# Patient Record
Sex: Female | Born: 1975 | Race: White | Hispanic: No | Marital: Married | Smoking: Former smoker
Health system: Southern US, Community
[De-identification: ages and names within clinical notes are randomized; demographics above are authoritative.]

## PROBLEM LIST (undated history)

## (undated) DIAGNOSIS — E039 Hypothyroidism, unspecified: Secondary | ICD-10-CM

## (undated) DIAGNOSIS — D649 Anemia, unspecified: Secondary | ICD-10-CM

## (undated) DIAGNOSIS — F419 Anxiety disorder, unspecified: Secondary | ICD-10-CM

## (undated) DIAGNOSIS — F329 Major depressive disorder, single episode, unspecified: Secondary | ICD-10-CM

## (undated) DIAGNOSIS — F32A Depression, unspecified: Secondary | ICD-10-CM

## (undated) HISTORY — DX: Depression, unspecified: F32.A

## (undated) HISTORY — DX: Anemia, unspecified: D64.9

## (undated) HISTORY — DX: Hypothyroidism, unspecified: E03.9

## (undated) HISTORY — DX: Anxiety disorder, unspecified: F41.9

## (undated) HISTORY — DX: Major depressive disorder, single episode, unspecified: F32.9

## (undated) HISTORY — PX: NO PAST SURGERIES: SHX2092

---

## 1998-05-28 ENCOUNTER — Inpatient Hospital Stay (HOSPITAL_COMMUNITY): Admission: AD | Admit: 1998-05-28 | Discharge: 1998-05-31 | Payer: Self-pay

## 1999-12-21 ENCOUNTER — Other Ambulatory Visit: Admission: RE | Admit: 1999-12-21 | Discharge: 1999-12-21 | Payer: Self-pay | Admitting: Obstetrics and Gynecology

## 2000-07-19 ENCOUNTER — Inpatient Hospital Stay (HOSPITAL_COMMUNITY): Admission: AD | Admit: 2000-07-19 | Discharge: 2000-07-21 | Payer: Self-pay | Admitting: Obstetrics and Gynecology

## 2001-01-18 ENCOUNTER — Other Ambulatory Visit: Admission: RE | Admit: 2001-01-18 | Discharge: 2001-01-18 | Payer: Self-pay | Admitting: Obstetrics and Gynecology

## 2001-11-22 ENCOUNTER — Ambulatory Visit (HOSPITAL_COMMUNITY): Admission: RE | Admit: 2001-11-22 | Discharge: 2001-11-22 | Payer: Self-pay | Admitting: Obstetrics & Gynecology

## 2001-11-22 ENCOUNTER — Encounter: Payer: Self-pay | Admitting: Obstetrics & Gynecology

## 2002-01-19 ENCOUNTER — Encounter: Payer: Self-pay | Admitting: Obstetrics & Gynecology

## 2002-01-19 ENCOUNTER — Ambulatory Visit (HOSPITAL_COMMUNITY): Admission: RE | Admit: 2002-01-19 | Discharge: 2002-01-19 | Payer: Self-pay | Admitting: Obstetrics & Gynecology

## 2004-06-22 ENCOUNTER — Emergency Department: Payer: Self-pay | Admitting: Emergency Medicine

## 2006-06-20 ENCOUNTER — Inpatient Hospital Stay: Payer: Self-pay | Admitting: Obstetrics and Gynecology

## 2010-03-05 ENCOUNTER — Emergency Department: Payer: Self-pay | Admitting: Emergency Medicine

## 2011-12-14 IMAGING — CR DG CHEST 1V PORT
1 series · 1 of 1 positions shown · non-contrast
Comparison: none

REASON FOR EXAM: cp
COMMENTS:

PROCEDURE:     DXR - DXR PORTABLE CHEST SINGLE VIEW  - March 05, 2010 [DATE]
RESULT:     The lung fields are clear. The heart, mediastinal and osseous
structures show no significant abnormalities.

[view not recorded]
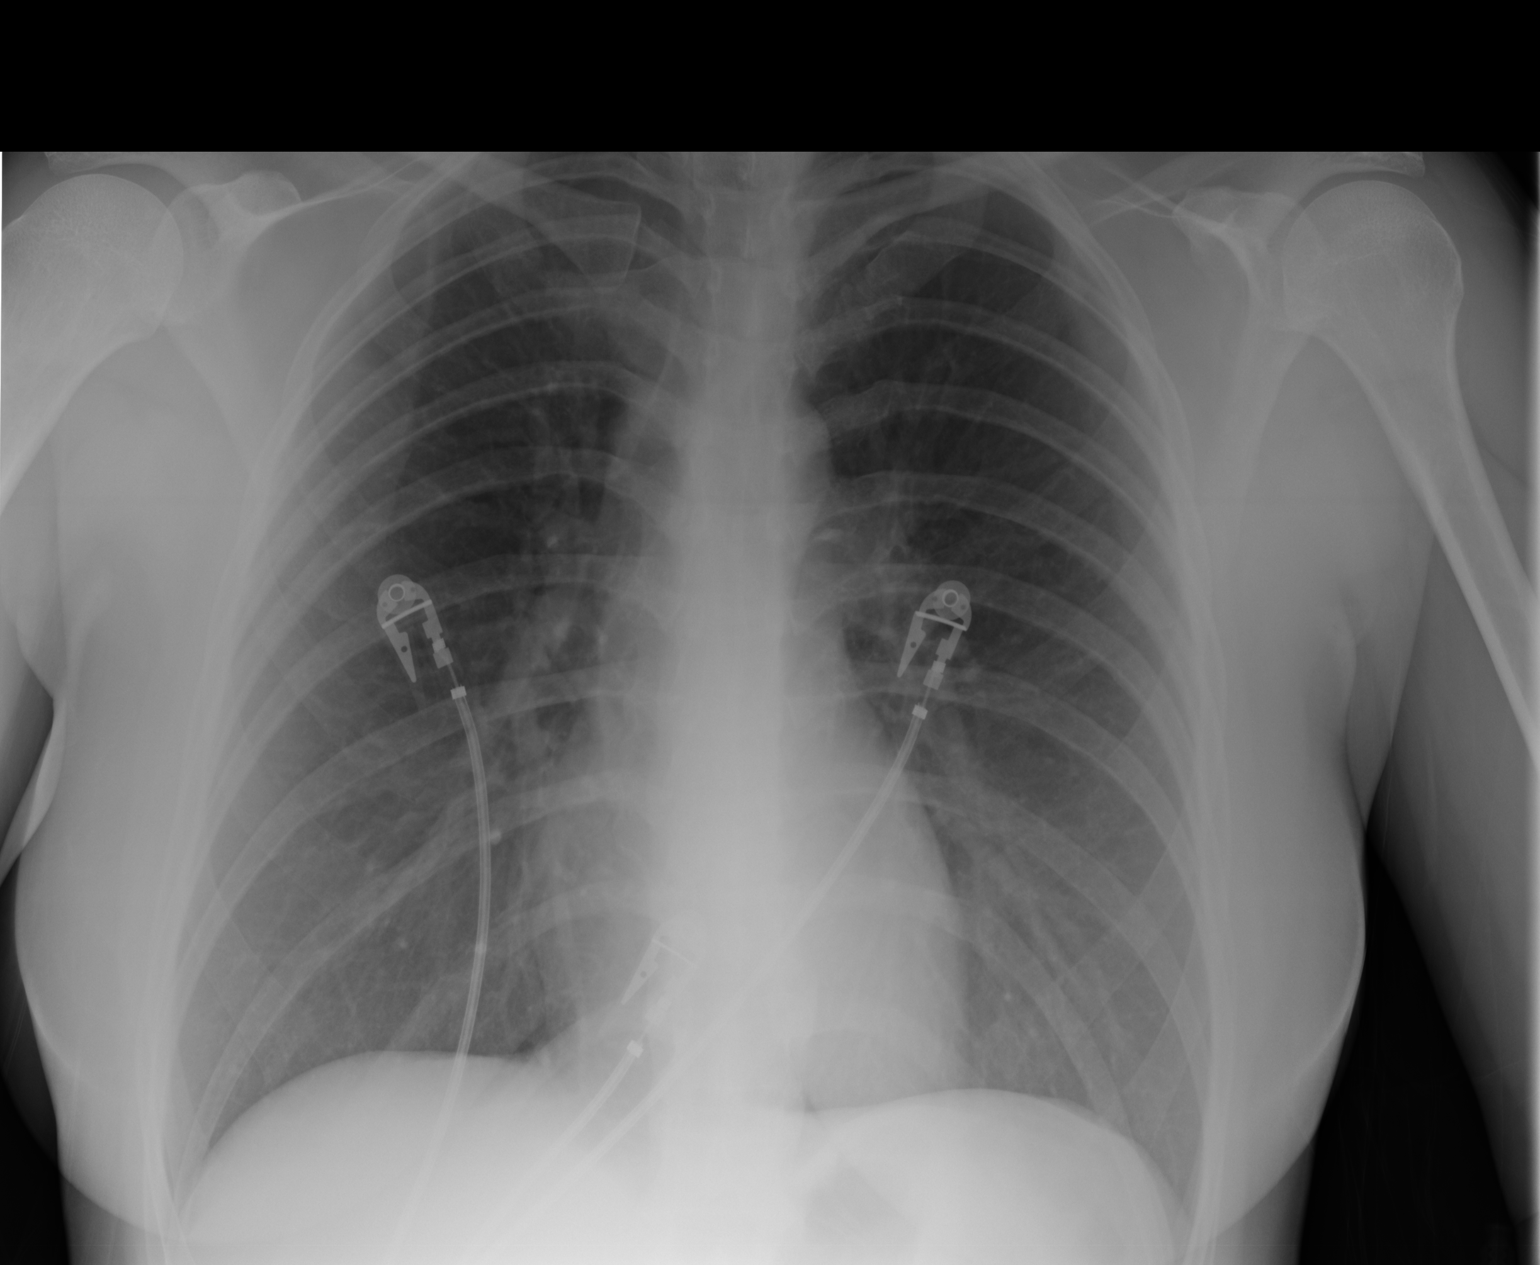

[1 of 1 positions shown; findings below may reference images not displayed]

IMPRESSION: No significant abnormalities are noted.

## 2012-07-01 ENCOUNTER — Emergency Department: Payer: Self-pay | Admitting: Emergency Medicine

## 2012-07-01 LAB — BASIC METABOLIC PANEL
Anion Gap: 11 (ref 7–16)
BUN: 10 mg/dL (ref 7–18)
Calcium, Total: 9 mg/dL (ref 8.5–10.1)
Chloride: 101 mmol/L (ref 98–107)
Co2: 23 mmol/L (ref 21–32)
Creatinine: 1.18 mg/dL (ref 0.60–1.30)
EGFR (African American): >60
EGFR (Non-African Amer.): 58 — ABNORMAL LOW
Glucose: 109 mg/dL — ABNORMAL HIGH (ref 65–99)
Osmolality: 270 (ref 275–301)
Potassium: 3.4 mmol/L — ABNORMAL LOW (ref 3.5–5.1)
Sodium: 135 mmol/L — ABNORMAL LOW (ref 136–145)

## 2012-07-01 LAB — CK TOTAL AND CKMB (NOT AT ARMC)
CK, Total: 73 U/L (ref 21–215)
CK-MB: <0.5 ng/mL — ABNORMAL LOW (ref 0.5–3.6)

## 2012-07-01 LAB — CBC
HCT: 41.3 % (ref 35.0–47.0)
HGB: 14.2 g/dL (ref 12.0–16.0)
MCH: 31.3 pg (ref 26.0–34.0)
MCHC: 34.4 g/dL (ref 32.0–36.0)
MCV: 91 fL (ref 80–100)
Platelet: 246 10*3/uL (ref 150–440)
RBC: 4.54 10*6/uL (ref 3.80–5.20)
RDW: 13.4 % (ref 11.5–14.5)
WBC: 6.4 10*3/uL (ref 3.6–11.0)

## 2012-07-01 LAB — TROPONIN I: Troponin-I: <0.02 ng/mL

## 2012-07-01 LAB — TSH: Thyroid Stimulating Horm: 5.6 u[IU]/mL — ABNORMAL HIGH

## 2013-04-27 ENCOUNTER — Ambulatory Visit: Payer: Self-pay | Admitting: Family Medicine

## 2015-02-05 IMAGING — CR LEFT KNEE - 3 VIEW
1 series · 4 of 4 positions shown · non-contrast
Comparison: none

REASON FOR EXAM: L knee pain
COMMENTS:

PROCEDURE:     KDR - KDXR KNEE LT- 3 VIEWS ONLY  - April 27, 2013  [DATE]
RESULT:     Four views of the left knee reveal the bones to be adequately
mineralized. There is no evidence of an acute fracture nor dislocation. The
overlying soft tissues are normal in appearance.

[Series 1: ap · 0.17mm/px · 4 of 4 slices shown]
[im 1/4]
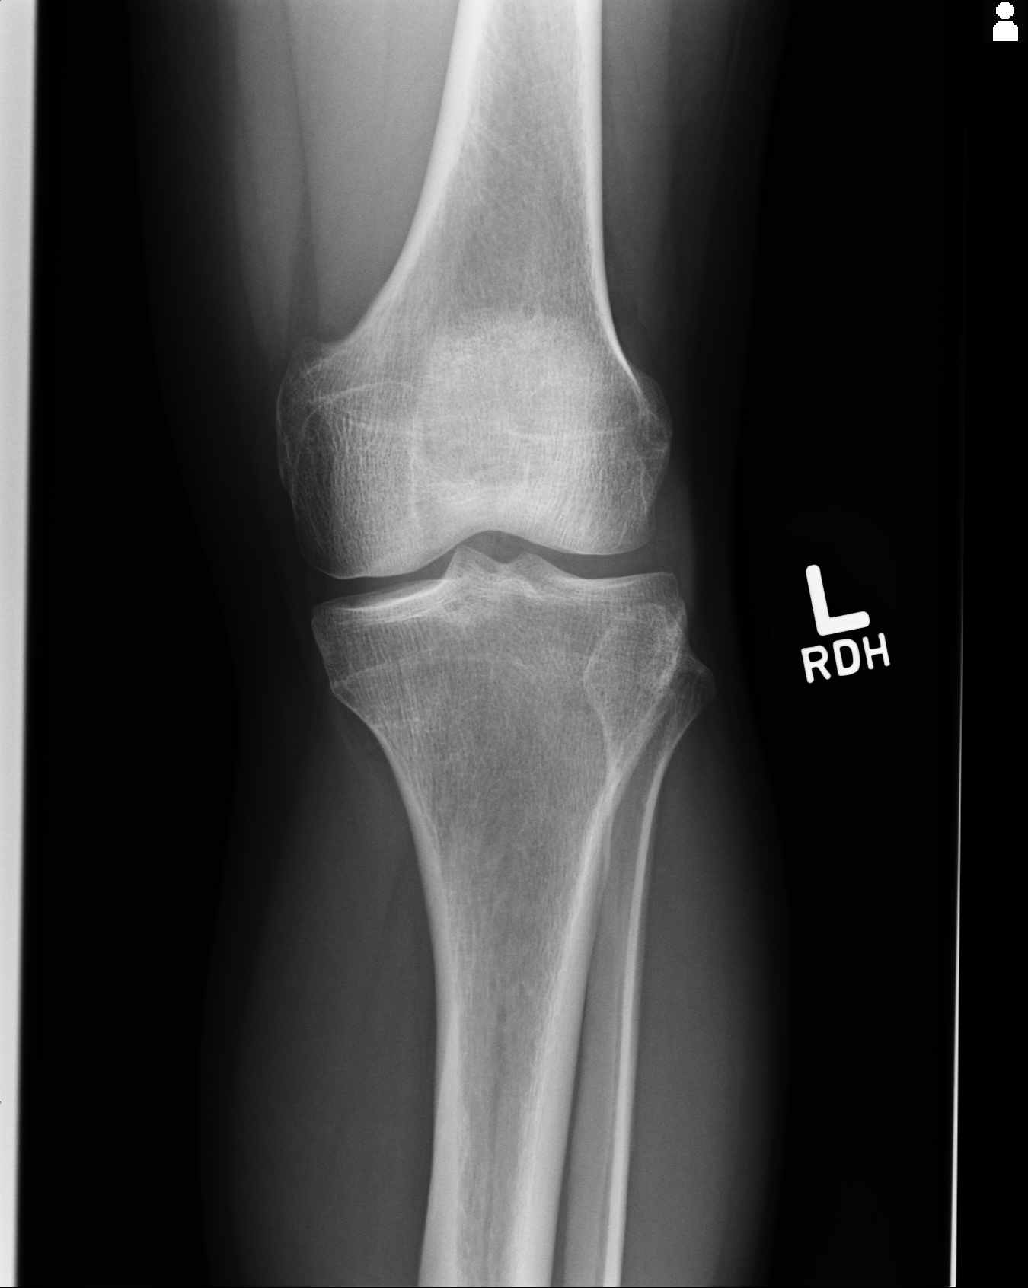
[im 2/4]
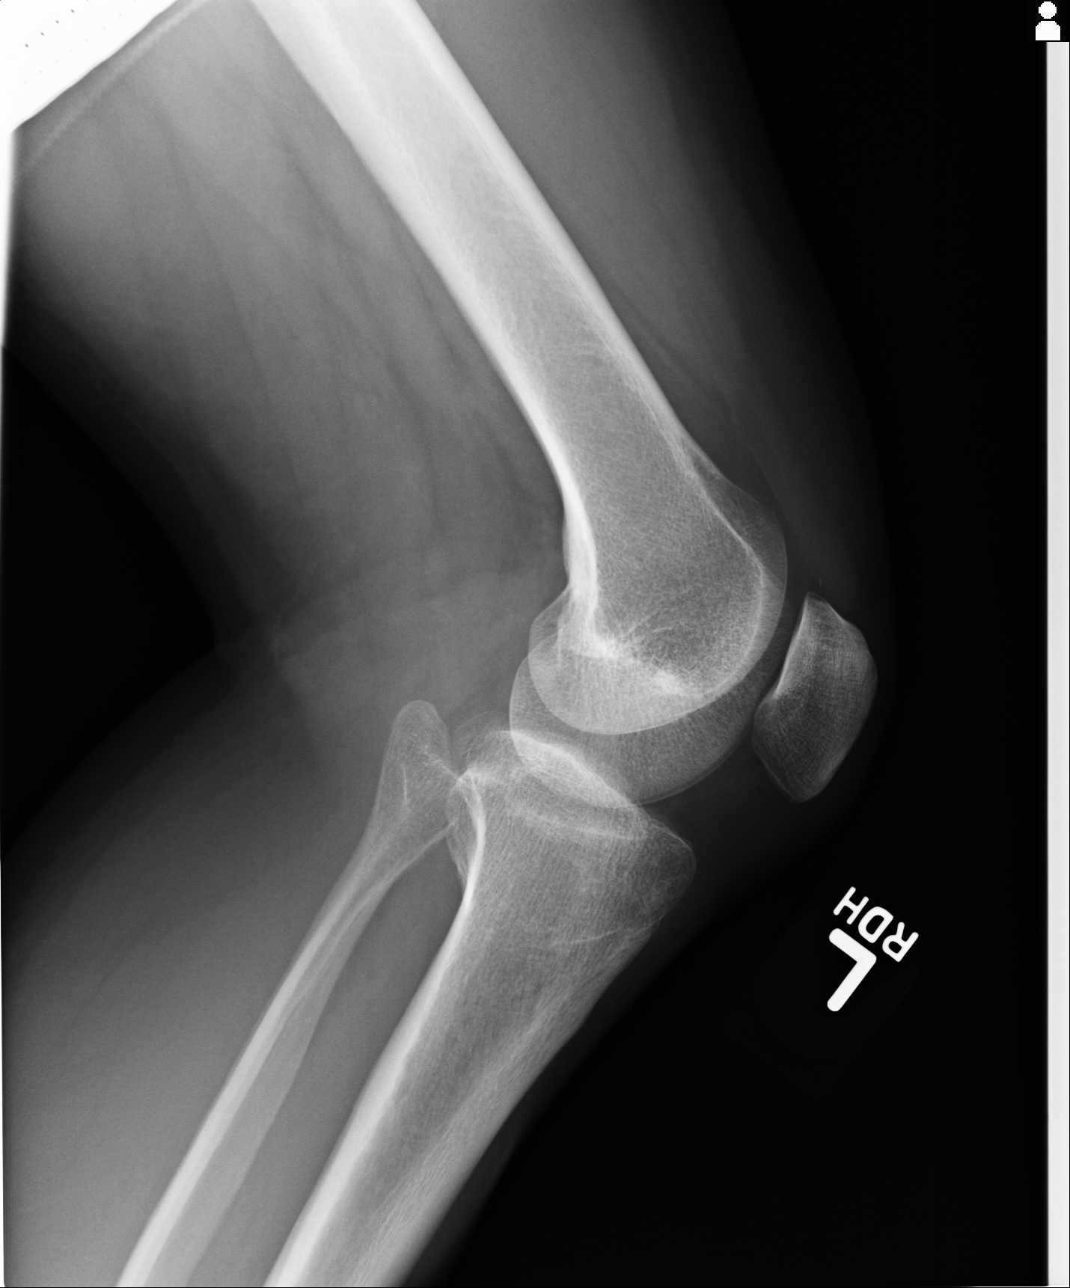
[im 3/4]
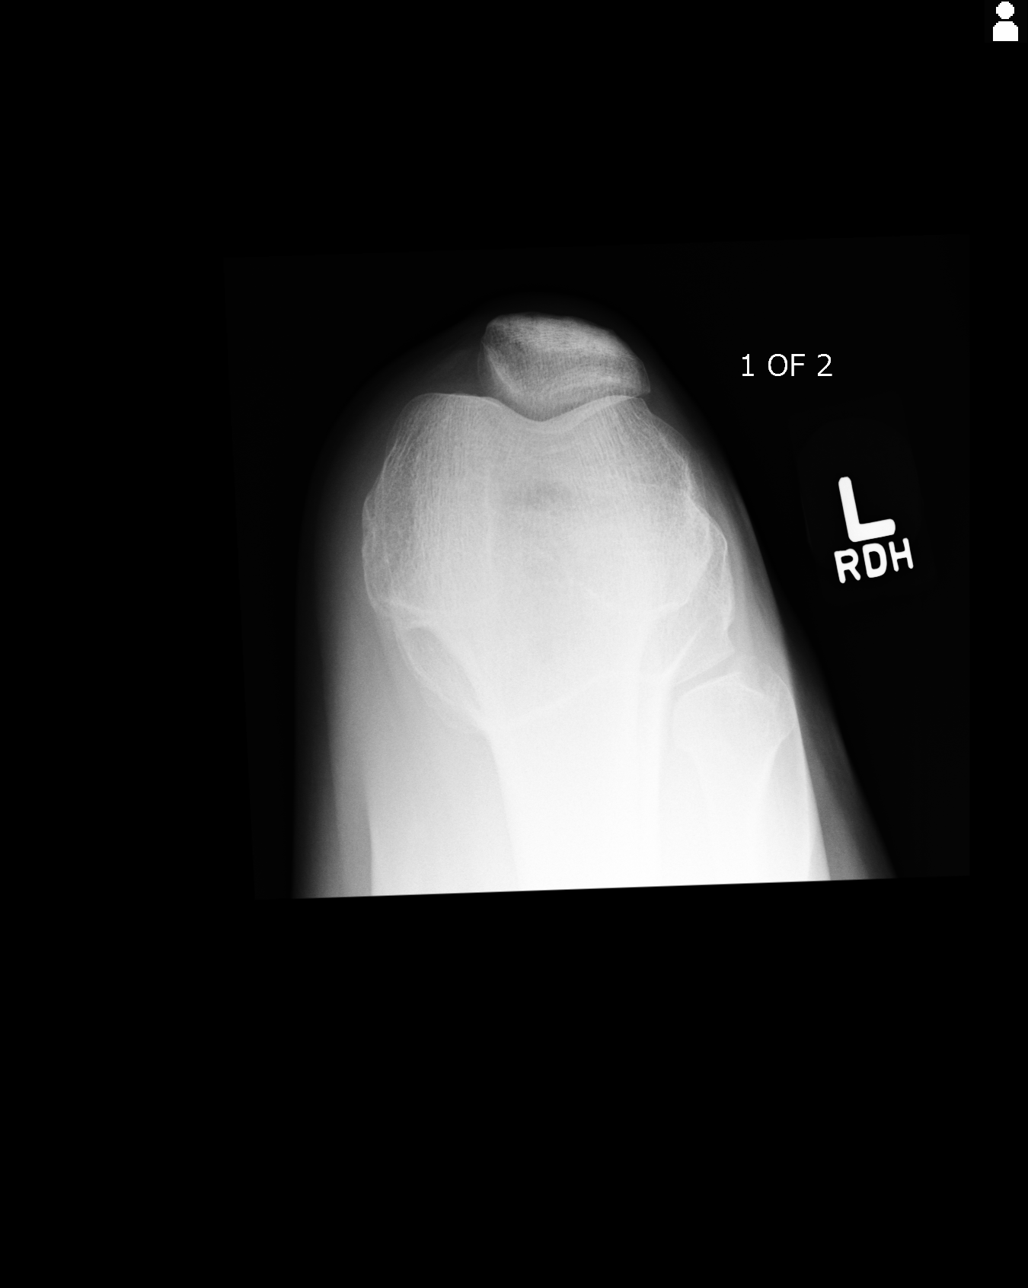
[im 4/4]
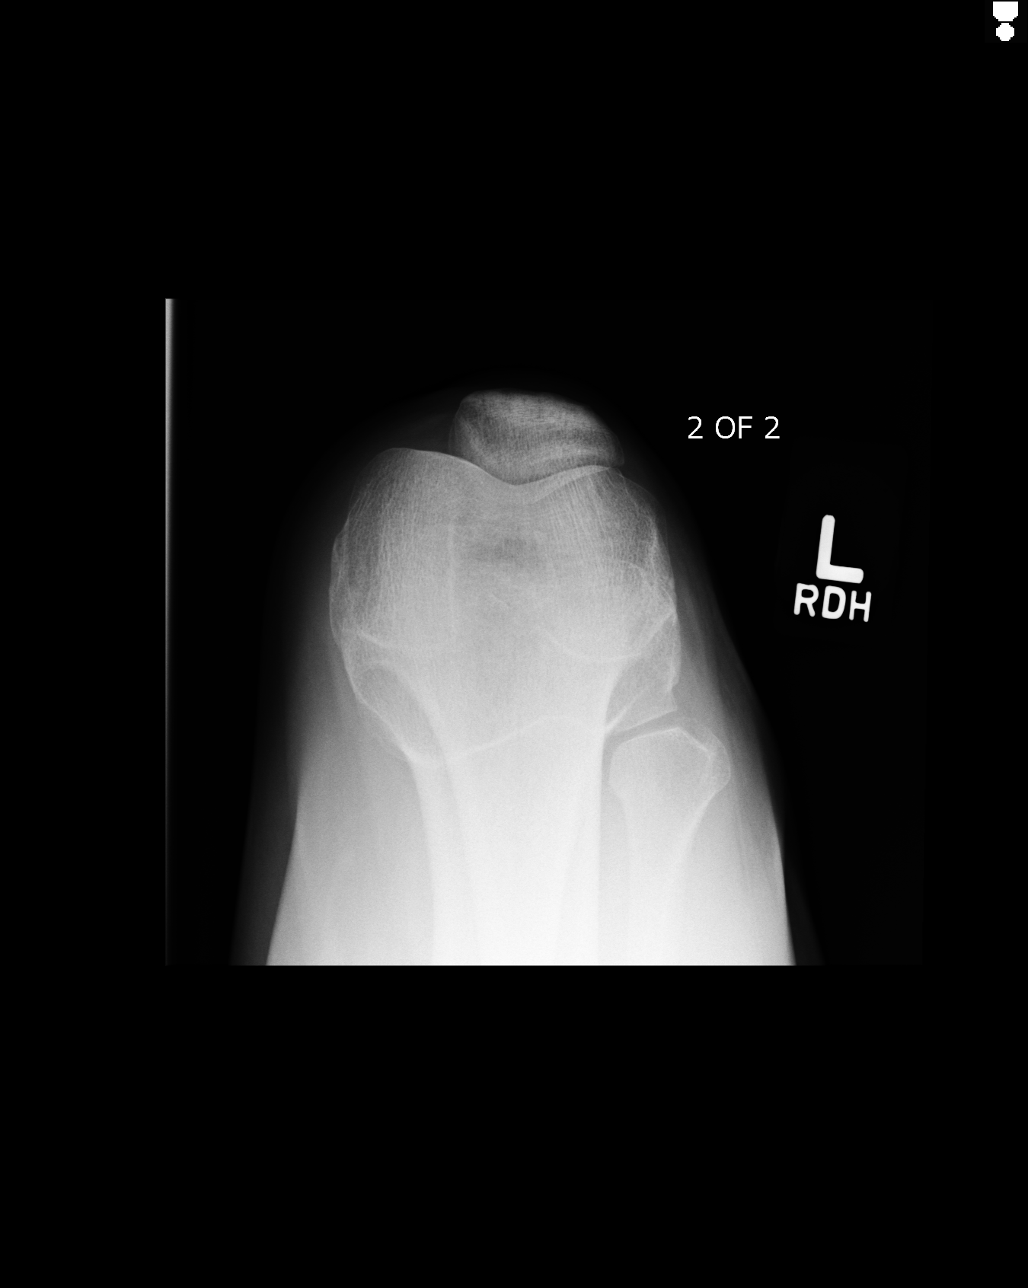

[4 of 4 positions shown; findings below may reference images not displayed]

IMPRESSION: There is no acute bony abnormality of the left knee. Given
the chronic symptoms, followup MRI may be useful.

[REDACTED]

## 2015-05-16 ENCOUNTER — Encounter: Payer: Self-pay | Admitting: Family Medicine

## 2015-05-16 ENCOUNTER — Ambulatory Visit (INDEPENDENT_AMBULATORY_CARE_PROVIDER_SITE_OTHER): Payer: BLUE CROSS/BLUE SHIELD | Admitting: Family Medicine

## 2015-05-16 VITALS — BP 116/74 | HR 66 | Temp 97.8°F | Resp 16 | Wt 140.4 lb

## 2015-05-16 DIAGNOSIS — M7661 Achilles tendinitis, right leg: Secondary | ICD-10-CM | POA: Diagnosis not present

## 2015-05-16 DIAGNOSIS — Z87898 Personal history of other specified conditions: Secondary | ICD-10-CM

## 2015-05-16 DIAGNOSIS — M7662 Achilles tendinitis, left leg: Secondary | ICD-10-CM

## 2015-05-16 DIAGNOSIS — E039 Hypothyroidism, unspecified: Secondary | ICD-10-CM | POA: Insufficient documentation

## 2015-05-16 MED ORDER — FUROSEMIDE 20 MG PO TABS: 20.0000 mg | ORAL_TABLET | Freq: Every day | ORAL | Status: AC

## 2015-05-16 NOTE — Progress Notes (Signed)
Subjective:     Patient ID: Hailey Cantrell, female   DOB: June 09, 1976, 39 y.o.   MRN: [id number]  HPI  Chief Complaint  Patient presents with  . Ankle Pain    Patient comes in office today with concerns of pain in her left ankle and heel of foot. Patient reports that she is a active runner that runs daily, on Saturday 9/24 patient participated in a race and states that she pushed her self harder than she has before. Patient denies fall but states after the race she noticed pain in ankle and next morning had bruising around ankle.   Continues to have bilateral ankle pain particularly going up and down steps. Reports her 5K race was on even surfaces, she typically wears a "five fingers" sock like shoe. States she typically will run daily 5-8 miles. Has been taking ibuprofen 600 mg as needed and icing for pain.   Review of Systems  Endocrine:       Also wishes refill on furosemide which she uses for PMS related edema.       Objective:   Physical Exam  Constitutional: She appears well-developed and well-nourished. No distress.  Musculoskeletal:  Tender over bilateral ankles (left > right) at insertion of Achille's. Bilateral DF/PF 5/5; ankle ligaments stable with no pain on testing.       Assessment:    1. Achilles tendonitis, bilateral: may also be bursitis involvement. - Ambulatory referral to Podiatry  2. History of peripheral edema - furosemide (LASIX) 20 MG tablet; Take 1 tablet (20 mg total) by mouth daily. As needed for swelling  Dispense: 30 tablet; Refill: 3    Plan:    Cross train and stop running for now. Schedule ibuprofen 600 mg. 3 x day with food.

## 2015-05-16 NOTE — Patient Instructions (Signed)
Schedule ibuprofen 600 mg. 3 x day with meals. Cross train without running until seen by the podiatrist.

## 2015-05-20 ENCOUNTER — Ambulatory Visit (INDEPENDENT_AMBULATORY_CARE_PROVIDER_SITE_OTHER): Payer: BLUE CROSS/BLUE SHIELD

## 2015-05-20 ENCOUNTER — Ambulatory Visit (INDEPENDENT_AMBULATORY_CARE_PROVIDER_SITE_OTHER): Payer: BLUE CROSS/BLUE SHIELD | Admitting: Sports Medicine

## 2015-05-20 DIAGNOSIS — M25579 Pain in unspecified ankle and joints of unspecified foot: Secondary | ICD-10-CM

## 2015-05-20 DIAGNOSIS — M216X9 Other acquired deformities of unspecified foot: Secondary | ICD-10-CM

## 2015-05-20 DIAGNOSIS — M79671 Pain in right foot: Secondary | ICD-10-CM | POA: Diagnosis not present

## 2015-05-20 DIAGNOSIS — M21869 Other specified acquired deformities of unspecified lower leg: Secondary | ICD-10-CM

## 2015-05-20 DIAGNOSIS — M779 Enthesopathy, unspecified: Secondary | ICD-10-CM | POA: Diagnosis not present

## 2015-05-20 DIAGNOSIS — M79672 Pain in left foot: Secondary | ICD-10-CM

## 2015-05-20 MED ORDER — TRIAMCINOLONE ACETONIDE 10 MG/ML IJ SUSP: 10.0000 mg | Freq: Once | INTRAMUSCULAR | Status: DC

## 2015-05-20 MED ORDER — MELOXICAM 15 MG PO TABS: 15.0000 mg | ORAL_TABLET | Freq: Every day | ORAL | Status: DC

## 2015-05-20 NOTE — Progress Notes (Signed)
Subjective:    Patient ID: Janne Napoleon, female    DOB: 07-01-1976, 39 y.o.   MRN: [id number]  HPI: [place] 39 y/o female patient presents to office complaining of pain in both ankles Left now greater than right. Patient reports that she is a runner and about 1 month ago was running in her Vibram 5 shoes and started to get pain in both ankles. The pain gradually got better with rest however 2 weeks ago was running and started having increased symptoms in left ankle and shins; 4/10, throbbing. Patient reports that this run she participated in 2 weeks ago consisted of a lot of hills. Since patient has tried rest, epsom salt soaks, icing with some relief. Walking or jogging makes the pain worse. Patient denies any injury or any other acute problems.   Patient Active Problem List   Diagnosis Date Noted  . Hypothyroidism 05/16/2015   Current Outpatient Prescriptions on File Prior to Visit  Medication Sig Dispense Refill  . Ashwagandha 500 MG CAPS Take by mouth 2 (two) times daily.    . ferrous sulfate 325 (65 FE) MG tablet Take 325 mg by mouth daily with breakfast.    . furosemide (LASIX) 20 MG tablet Take 1 tablet (20 mg total) by mouth daily. As needed for swelling 30 tablet 3  . levothyroxine (SYNTHROID, LEVOTHROID) 150 MCG tablet Take 150 mcg by mouth daily before breakfast.    . Misc Natural Products (OSTEO BI-FLEX ADV JOINT SHIELD PO) Take by mouth daily.     No current facility-administered medications on file prior to visit.   Allergies  Allergen Reactions  . Sulfa Antibiotics Other (See Comments)    vomiting    Review of Systems  Endocrine: Positive for cold intolerance.  Musculoskeletal: Positive for gait problem.       Joint pain   All other systems reviewed and are negative.      Objective:   Physical Exam   Objective:  General: Alert and oriented x3 in no acute distress  Dermatology: No open lesions bilateral lower extremities, no webspace macerations, no  ecchymosis bilateral, all nails x 10 are well manicured.  Vascular: Dorsalis Pedis and Posterior Tibial pedal pulses 2/4, Capillary Fill Time 3 seconds, + pedal hair growth bilateral, no edema bilateral lower extremities, Temperature gradient within normal limits.  Neurology: Gross sensation intact via light touch bilateral, Protective sensation intact  with Semmes Weinstein Monofilament to all pedal sites, Position sense intact, vibratory intact bilateral, Deep tendon reflexes within normal limits bilateral, No babinski sign present bilateral. No Tinels sign bilateral foot.   Musculoskeletal: There is tenderness with palpation at medial insertion of the Achilles on Left, there is no calcaneal exostosis, There is no pain with palpation to medial and lateral ankle gutters bilateral. There is mild tenderness to anterior shins bilateral and course of PT and PL tendons on left, tendons intact and functioning with full 5/5 muscular strength in all groups, There is decreased ankle rom with knee extending  vs flexed resembling gastroc equnius bilateral, The achilles tendon feels intact with no nodularity or palpable dell, Thompson sign negative, Subtalar and midtarsal joint range of motion is within normal limits, there is no 1st ray hypermobility or forefoot deformity noted bilateral.   Gait: Antalgic gait with increased heel off bilateral.   Xrays  Left & Right Ankle/Foot 3 views    Impression: Kagers triangle intact, Ankle mortise intact, no DJD, no fracture/stress reaction, mild midtarsal sag and 1st  ray elevatus          Assessment & Plan:   Problem List Items Addressed This Visit    None    Visit Diagnoses    Ankle pain, unspecified laterality    -  Primary    Relevant Orders    DG Ankle Complete Left    DG Ankle Complete Right    Tendonitis        Relevant Medications    triamcinolone acetonide (KENALOG) 10 MG/ML injection 10 mg    Foot pain, bilateral        Gastrocnemius equinus,  unspecified laterality           -Complete examination performed -Xrays reviewed -Discussed treatement options -After verbal consent injected medial insertion of the achilles with 1cc lidocaine plain, 1cc marcaine plain, 0.5cc kenalog 10 mixed with 0.5cc dexamethasone. Advised patient of risks associated with injection. Advised avoid vigorous activity. Dispensed CAM boot to wear at all times when ambulating. -Rx Meloxicam and gentle passive stretching at end of day. Recommend ice the area 2-3x per day. -Will recheck in 2 weeks. Patient to bring in running shoes next visit.  -Patient to return to office sooner if condition worsens.  Asencion Islam, DPM

## 2015-05-21 ENCOUNTER — Encounter: Payer: Self-pay | Admitting: Sports Medicine

## 2015-05-23 ENCOUNTER — Encounter: Payer: Self-pay | Admitting: Sports Medicine

## 2015-06-03 ENCOUNTER — Ambulatory Visit (INDEPENDENT_AMBULATORY_CARE_PROVIDER_SITE_OTHER): Payer: BLUE CROSS/BLUE SHIELD | Admitting: Sports Medicine

## 2015-06-03 ENCOUNTER — Encounter: Payer: Self-pay | Admitting: Sports Medicine

## 2015-06-03 VITALS — BP 117/74 | HR 78 | Resp 16

## 2015-06-03 DIAGNOSIS — M779 Enthesopathy, unspecified: Secondary | ICD-10-CM

## 2015-06-03 DIAGNOSIS — M79672 Pain in left foot: Secondary | ICD-10-CM

## 2015-06-03 DIAGNOSIS — M25579 Pain in unspecified ankle and joints of unspecified foot: Secondary | ICD-10-CM | POA: Diagnosis not present

## 2015-06-03 DIAGNOSIS — M21869 Other specified acquired deformities of unspecified lower leg: Secondary | ICD-10-CM

## 2015-06-03 DIAGNOSIS — M216X9 Other acquired deformities of unspecified foot: Secondary | ICD-10-CM

## 2015-06-03 DIAGNOSIS — M79671 Pain in right foot: Secondary | ICD-10-CM | POA: Diagnosis not present

## 2015-06-03 NOTE — Progress Notes (Signed)
Patient ID: Hailey Cantrell, female   DOB: 05/09/1976, 39 y.o.   MRN: [id number] Subjective: Hailey Cantrell is a 39 y.o. female patient who returns to office for follow up evaluation of Left>Right ankle/foot pain. Patient states that she is feeling better. Patient has been using CAM walker with relief in symptoms. Patient denies any other pedal complaints.   Objective:  General: Alert and oriented x3 in no acute distress  Dermatology: No open lesions bilateral lower extremities, no webspace macerations, no ecchymosis bilateral, all nails x 10 are well manicured.  Neurovascular: Intact. No lower extremity edema bilateral.   Musculoskeletal:Decreased tenderness with palpation at medial insertion of achilles on Left, No other pedal pains,No pain with calf compression bilateral. Range of motion in all pedal joints are within normal limits without pain or crepitation; Mild limitation at ankles bilateral, improving with stretching. Strength within normal limits in all groups bilateral.   Assessment and Plan: Problem List Items Addressed This Visit    None    Visit Diagnoses    Ankle pain, unspecified laterality    -  Primary    L>R, Improved    Foot pain, bilateral        Improved    Tendonitis        Improved    Gastrocnemius equinus, unspecified laterality           -Complete examination performed -Running shoes evaluated; Encouraged patient to return to a more supportive shoe with firmer heel counter  -May transition to normal shoes and may slowly start increasing activity/running; Advised patient to closely monitor symptoms. Patient made aware of the possibility of re-exacerbation if not wearing supportive shoes; Advised to ice, stretch, gentle massage as needed. -Patient to return to office as needed or sooner if condition worsens.  Asencion Islamitorya Zariana Strub, DPM

## 2015-07-23 ENCOUNTER — Other Ambulatory Visit: Payer: Self-pay | Admitting: Family Medicine

## 2015-07-23 DIAGNOSIS — E039 Hypothyroidism, unspecified: Secondary | ICD-10-CM

## 2015-07-25 ENCOUNTER — Other Ambulatory Visit: Payer: Self-pay | Admitting: Family Medicine

## 2015-07-26 LAB — TSH: TSH: 0.37 u[IU]/mL — AB (ref 0.450–4.500)

## 2015-07-26 LAB — T4, FREE: Free T4: 1.44 ng/dL (ref 0.82–1.77)

## 2015-07-29 ENCOUNTER — Other Ambulatory Visit: Payer: Self-pay | Admitting: Family Medicine

## 2015-07-29 ENCOUNTER — Telehealth: Payer: Self-pay

## 2015-07-29 NOTE — Telephone Encounter (Signed)
-----   Message from Anola Gurneyobert Chauvin, [place] sent at 07/29/2015  3:11 PM EST ----- Labs ok, repeat annually

## 2015-07-29 NOTE — Telephone Encounter (Signed)
[id number] 

## 2015-07-30 NOTE — Telephone Encounter (Signed)
Patient has been advised of lab report. KW 

## 2015-07-30 NOTE — Telephone Encounter (Signed)
LMTCB

## 2015-08-13 ENCOUNTER — Ambulatory Visit (INDEPENDENT_AMBULATORY_CARE_PROVIDER_SITE_OTHER): Payer: BLUE CROSS/BLUE SHIELD | Admitting: Family Medicine

## 2015-08-13 ENCOUNTER — Encounter: Payer: Self-pay | Admitting: Family Medicine

## 2015-08-13 VITALS — BP 108/68 | HR 66 | Temp 97.7°F | Resp 16 | Wt 146.0 lb

## 2015-08-13 DIAGNOSIS — J0100 Acute maxillary sinusitis, unspecified: Secondary | ICD-10-CM | POA: Diagnosis not present

## 2015-08-13 DIAGNOSIS — R609 Edema, unspecified: Secondary | ICD-10-CM | POA: Insufficient documentation

## 2015-08-13 DIAGNOSIS — D649 Anemia, unspecified: Secondary | ICD-10-CM | POA: Insufficient documentation

## 2015-08-13 DIAGNOSIS — F341 Dysthymic disorder: Secondary | ICD-10-CM | POA: Insufficient documentation

## 2015-08-13 MED ORDER — FLUCONAZOLE 150 MG PO TABS: 150.0000 mg | ORAL_TABLET | Freq: Once | ORAL | Status: DC

## 2015-08-13 MED ORDER — AMOXICILLIN-POT CLAVULANATE 875-125 MG PO TABS: 1.0000 | ORAL_TABLET | Freq: Two times a day (BID) | ORAL | Status: DC

## 2015-08-13 NOTE — Patient Instructions (Signed)
Continue Mucinex D and Delsym

## 2015-08-13 NOTE — Progress Notes (Signed)
Subjective:     Patient ID: Hailey Cantrell, female   DOB: July 28, 1976, [age] y.o.   MRN: [id number]  HPI  Chief Complaint  Patient presents with  . Cough    and congestion for 2 weeks  States sinus congestion remains purulent accompanied by fatigue. Has been using Mucinex D, Benadryl and Delsym.   Review of Systems  Constitutional: Positive for fever (low grade at onset). Negative for chills.       Objective:   Physical Exam  Constitutional: She appears well-developed and well-nourished. No distress.  Ears: T.M's intact without inflammation Sinuses: non-tender Throat: no tonsillar enlargement or exudate Neck: no cervical adenopathy Lungs: clear     Assessment:    1. Acute maxillary sinusitis, recurrence not specified - amoxicillin-clavulanate (AUGMENTIN) 875-125 MG tablet; Take 1 tablet by mouth 2 (two) times daily.  Dispense: 20 tablet; Refill: 0 - fluconazole (DIFLUCAN) 150 MG tablet; Take 1 tablet (150 mg total) by mouth once.  Dispense: 1 tablet; Refill: 1    Plan:    Continue otc medication.

## 2015-10-09 ENCOUNTER — Encounter: Payer: Self-pay | Admitting: Family Medicine

## 2015-10-09 ENCOUNTER — Ambulatory Visit (INDEPENDENT_AMBULATORY_CARE_PROVIDER_SITE_OTHER): Payer: BLUE CROSS/BLUE SHIELD | Admitting: Family Medicine

## 2015-10-09 VITALS — BP 112/72 | HR 68 | Temp 97.9°F | Resp 16 | Ht 62.25 in | Wt 146.0 lb

## 2015-10-09 DIAGNOSIS — T148 Other injury of unspecified body region: Secondary | ICD-10-CM | POA: Diagnosis not present

## 2015-10-09 DIAGNOSIS — L259 Unspecified contact dermatitis, unspecified cause: Secondary | ICD-10-CM

## 2015-10-09 DIAGNOSIS — W57XXXA Bitten or stung by nonvenomous insect and other nonvenomous arthropods, initial encounter: Secondary | ICD-10-CM

## 2015-10-09 MED ORDER — PREDNISONE 20 MG PO TABS: ORAL_TABLET | ORAL | Status: DC

## 2015-10-09 NOTE — Patient Instructions (Signed)
Discussed taking Claritin and Benadryl to help with itching. May continue topical over the counter medication.

## 2015-10-09 NOTE — Progress Notes (Signed)
Subjective:     Patient ID: Hailey Cantrell, female   DOB: 04-Dec-1975, 40 y.o.   MRN: [id number]  HPI  Chief Complaint  Patient presents with  . Rash    Patient reports that she has poision ivy all over her body. She reports that it is starting to spread. She has been using calimine lotion and Benadryl OTC with no relief. Patient reports that she has had symptoms for about 4 days.   States she was pulling out [place] and poison ivy vines on her property prior to the onset of symptoms. States she was wearing a tank top with jeans and boots.   Review of Systems     Objective:   Physical Exam  Constitutional: She appears well-developed and well-nourished. No distress.  Skin:  Numerous areas of rash with small vesicles in a linear pattern on her upper extremities. A few papules c/w insect bites on her upper abdomen       Assessment:    1. Contact dermatitis - predniSONE (DELTASONE) 20 MG tablet; Taper as follows: 3 pills for 4 days, two pills for 4 days, one pill for four days  Dispense: 24 tablet; Refill: 0  2. Insect bites - predniSONE (DELTASONE) 20 MG tablet; Taper as follows: 3 pills for 4 days, two pills for 4 days, one pill for four days  Dispense: 24 tablet; Refill: 0    Plan:    Discussed use of oral antihistamines.

## 2016-06-29 DIAGNOSIS — Z1329 Encounter for screening for other suspected endocrine disorder: Secondary | ICD-10-CM | POA: Diagnosis not present

## 2016-06-29 DIAGNOSIS — Z01419 Encounter for gynecological examination (general) (routine) without abnormal findings: Secondary | ICD-10-CM | POA: Diagnosis not present

## 2016-06-29 DIAGNOSIS — Z131 Encounter for screening for diabetes mellitus: Secondary | ICD-10-CM | POA: Diagnosis not present

## 2016-06-29 DIAGNOSIS — Z1321 Encounter for screening for nutritional disorder: Secondary | ICD-10-CM | POA: Diagnosis not present

## 2016-06-29 DIAGNOSIS — Z136 Encounter for screening for cardiovascular disorders: Secondary | ICD-10-CM | POA: Diagnosis not present

## 2016-06-29 DIAGNOSIS — Z13220 Encounter for screening for lipoid disorders: Secondary | ICD-10-CM | POA: Diagnosis not present

## 2016-06-29 DIAGNOSIS — Z124 Encounter for screening for malignant neoplasm of cervix: Secondary | ICD-10-CM | POA: Diagnosis not present

## 2016-06-29 DIAGNOSIS — R918 Other nonspecific abnormal finding of lung field: Secondary | ICD-10-CM | POA: Diagnosis not present

## 2016-07-27 ENCOUNTER — Other Ambulatory Visit: Payer: Self-pay | Admitting: Family Medicine

## 2016-07-28 ENCOUNTER — Other Ambulatory Visit: Payer: Self-pay | Admitting: Family Medicine

## 2016-07-28 DIAGNOSIS — E039 Hypothyroidism, unspecified: Secondary | ICD-10-CM

## 2016-11-01 ENCOUNTER — Other Ambulatory Visit: Payer: Self-pay | Admitting: Family Medicine

## 2016-11-01 MED ORDER — LEVOTHYROXINE SODIUM 150 MCG PO TABS
150.0000 ug | ORAL_TABLET | Freq: Every day | ORAL | 0 refills | Status: DC
Start: 2016-11-01 — End: 2017-02-24

## 2016-11-02 ENCOUNTER — Telehealth: Payer: Self-pay

## 2016-11-02 NOTE — Telephone Encounter (Signed)
-----   Message from Anola Gurneyobert Chauvin, [place] sent at 11/01/2016  3:07 PM EDT ----- [facility] is still active.

## 2016-11-02 NOTE — Telephone Encounter (Signed)
Patient has been advised. KW 

## 2017-02-14 ENCOUNTER — Telehealth: Payer: Self-pay | Admitting: Family Medicine

## 2017-02-14 ENCOUNTER — Other Ambulatory Visit: Payer: Self-pay | Admitting: Family Medicine

## 2017-02-14 NOTE — Telephone Encounter (Signed)
Pt stated that she was supposed to have her TSH done December 2017 and she still hasn't had that done. Pt is requesting [facility] orders [facility] [address]. Please advise. Thanks TNP

## 2017-02-14 NOTE — Telephone Encounter (Signed)
Yes, it should be good for a year (says so on the lab requisition) We can always reprint it if she has a problem

## 2017-02-14 NOTE — Telephone Encounter (Signed)
[id number] 

## 2017-02-14 NOTE — Telephone Encounter (Signed)
Lab order should be good a year right ? KW

## 2017-02-15 NOTE — Telephone Encounter (Signed)
Reprinted. Per pt [facility] did not have the orders. Pt will come pick these up.

## 2017-02-17 DIAGNOSIS — E039 Hypothyroidism, unspecified: Secondary | ICD-10-CM | POA: Diagnosis not present

## 2017-02-23 ENCOUNTER — Telehealth: Payer: Self-pay | Admitting: Family Medicine

## 2017-02-23 NOTE — Telephone Encounter (Signed)
Please review, have you seen her results yet? Thank you-aa

## 2017-02-23 NOTE — Telephone Encounter (Signed)
pt called wanting to know if we have red her labs back yet.  She has been without her thyroid medication for several days.   She uses [facility]  Thanks teri

## 2017-02-24 ENCOUNTER — Other Ambulatory Visit: Payer: Self-pay | Admitting: Family Medicine

## 2017-02-24 MED ORDER — LEVOTHYROXINE SODIUM 150 MCG PO TABS: 150.0000 ug | ORAL_TABLET | Freq: Every day | ORAL | 0 refills | Status: DC

## 2017-02-24 NOTE — Telephone Encounter (Signed)
Call [facility] and see if they can fax results to us.

## 2017-02-24 NOTE — Telephone Encounter (Signed)
[id number] 

## 2017-02-24 NOTE — Telephone Encounter (Signed)
I have refilled her thyroid medication for 90 days but have not received her labs yet. Did she get them at [facility]?

## 2017-02-24 NOTE — Telephone Encounter (Signed)
Patient advised, she states that she had labs drawn last Thursday at [facility] [place]. KW

## 2017-02-24 NOTE — Telephone Encounter (Signed)
Called [facility] and they are faxing over the results-aa

## 2017-02-28 ENCOUNTER — Encounter: Payer: Self-pay | Admitting: Family Medicine

## 2017-05-30 ENCOUNTER — Other Ambulatory Visit: Payer: Self-pay | Admitting: Family Medicine

## 2017-07-13 DIAGNOSIS — Z131 Encounter for screening for diabetes mellitus: Secondary | ICD-10-CM | POA: Diagnosis not present

## 2017-07-13 DIAGNOSIS — Z1329 Encounter for screening for other suspected endocrine disorder: Secondary | ICD-10-CM | POA: Diagnosis not present

## 2017-07-13 DIAGNOSIS — Z13220 Encounter for screening for lipoid disorders: Secondary | ICD-10-CM | POA: Diagnosis not present

## 2017-07-13 DIAGNOSIS — Z124 Encounter for screening for malignant neoplasm of cervix: Secondary | ICD-10-CM | POA: Diagnosis not present

## 2017-07-13 DIAGNOSIS — Z01419 Encounter for gynecological examination (general) (routine) without abnormal findings: Secondary | ICD-10-CM | POA: Diagnosis not present

## 2017-07-13 DIAGNOSIS — Z136 Encounter for screening for cardiovascular disorders: Secondary | ICD-10-CM | POA: Diagnosis not present

## 2017-07-13 DIAGNOSIS — Z1321 Encounter for screening for nutritional disorder: Secondary | ICD-10-CM | POA: Diagnosis not present

## 2017-07-13 LAB — HM PAP SMEAR

## 2017-12-26 DIAGNOSIS — Z1231 Encounter for screening mammogram for malignant neoplasm of breast: Secondary | ICD-10-CM | POA: Diagnosis not present

## 2017-12-26 DIAGNOSIS — Z803 Family history of malignant neoplasm of breast: Secondary | ICD-10-CM | POA: Diagnosis not present

## 2018-02-09 ENCOUNTER — Ambulatory Visit: Payer: BLUE CROSS/BLUE SHIELD | Admitting: Family Medicine

## 2018-02-09 ENCOUNTER — Encounter: Payer: Self-pay | Admitting: Family Medicine

## 2018-02-09 VITALS — BP 104/80 | HR 74 | Temp 98.3°F | Resp 15 | Wt 138.4 lb

## 2018-02-09 DIAGNOSIS — E039 Hypothyroidism, unspecified: Secondary | ICD-10-CM

## 2018-02-09 DIAGNOSIS — L989 Disorder of the skin and subcutaneous tissue, unspecified: Secondary | ICD-10-CM | POA: Diagnosis not present

## 2018-02-09 NOTE — Progress Notes (Signed)
  Subjective:     Patient ID: Hailey Cantrell, female   DOB: Sep 29, 1975, 42 y.o.   MRN: [id number] Chief Complaint  Patient presents with  . Skin Problem    Patient comes in office today for skin check on her right arm. Patient states for the past 735yrs she has had a "liver spot" on her right forearm and in the past week it has become raised.    HPI States she has recurrent sun exposure. Also is due for thyroid labs.  Review of Systems     Objective:   Physical Exam  Constitutional: She appears well-developed and well-nourished. No distress.  Skin:  1 cm well circumscribed, slightly raised, tan lesion on her right posterior forearm.       Assessment:    1. Skin lesion of right arm - Ambulatory referral to Dermatology  2. Hypothyroidism, unspecified type - T4, free - TSH    Plan:    Further f/u pending lab work.

## 2018-02-09 NOTE — Patient Instructions (Signed)
Will call you about the lab results and dermatology referral.

## 2018-02-10 ENCOUNTER — Telehealth: Payer: Self-pay

## 2018-02-10 LAB — TSH: TSH: 0.035 u[IU]/mL — ABNORMAL LOW (ref 0.450–4.500)

## 2018-02-10 LAB — T4, FREE: Free T4: 2.01 ng/dL — ABNORMAL HIGH (ref 0.82–1.77)

## 2018-02-10 NOTE — Telephone Encounter (Signed)
[id number]-kw 

## 2018-02-10 NOTE — Telephone Encounter (Signed)
-----   Message from Anola Gurneyobert Chauvin, [place] sent at 02/10/2018  7:22 AM EDT ----- Thyroid levels are trending higher. Are you taking more than one pill daily?

## 2018-02-20 NOTE — Telephone Encounter (Signed)
[id number]-kw 

## 2018-03-02 ENCOUNTER — Other Ambulatory Visit: Payer: Self-pay | Admitting: Family Medicine

## 2018-03-02 MED ORDER — LEVOTHYROXINE SODIUM 137 MCG PO CAPS: 1.0000 | ORAL_CAPSULE | Freq: Every day | ORAL | 0 refills | Status: DC

## 2018-03-02 NOTE — Telephone Encounter (Signed)
[id number]-kw 

## 2018-03-02 NOTE — Telephone Encounter (Signed)
I have reduced her dose to 137 mcg and sent it in. Call for repeat lab slip in 6-8 weeks (before she runs out of medication)

## 2018-03-02 NOTE — Telephone Encounter (Signed)
Patient advised she states that she has been feeling fine and has been taking her pill once daily as prescribed. KW

## 2018-03-06 NOTE — Telephone Encounter (Signed)
Patient has been advised. KW 

## 2018-04-07 ENCOUNTER — Telehealth: Payer: Self-pay | Admitting: Family Medicine

## 2018-04-07 NOTE — Telephone Encounter (Signed)
Pt called saying she thinks she is having symptoms of hypothyroid.  She would like someone to call her back Monday to see if she needs to change medication doses.  CB# [phone]  Thanks Barth Kirksteri

## 2018-04-10 NOTE — Telephone Encounter (Signed)
[id number] 

## 2018-04-10 NOTE — Telephone Encounter (Signed)
Her thyroid testing in June was a bit HYPERthyroid. We need to investigate her symptoms more thoroughly with an office visit.

## 2018-04-10 NOTE — Telephone Encounter (Signed)
Patient states that she feels exhausted all the time. Patient states that she is sleeping 10hrs a night and taking 2hr naps during the day. She wanted to now from ou if its time to adjust medication? KW

## 2018-04-11 NOTE — Telephone Encounter (Signed)
lmtcb . KW 

## 2018-04-12 NOTE — Telephone Encounter (Signed)
Patient advised and appt scheduled for Friday. KW

## 2018-04-14 ENCOUNTER — Encounter: Payer: Self-pay | Admitting: Family Medicine

## 2018-04-14 ENCOUNTER — Ambulatory Visit (INDEPENDENT_AMBULATORY_CARE_PROVIDER_SITE_OTHER): Payer: BLUE CROSS/BLUE SHIELD | Admitting: Family Medicine

## 2018-04-14 VITALS — BP 102/66 | HR 68 | Temp 98.5°F | Resp 16 | Ht 62.75 in | Wt 139.0 lb

## 2018-04-14 DIAGNOSIS — E039 Hypothyroidism, unspecified: Secondary | ICD-10-CM

## 2018-04-14 DIAGNOSIS — R5383 Other fatigue: Secondary | ICD-10-CM | POA: Diagnosis not present

## 2018-04-14 NOTE — Progress Notes (Signed)
  Subjective:     Patient ID: Hailey Cantrell, female   DOB: Jul 31, 1976, 42 y.o.   MRN: [id number] Chief Complaint  Patient presents with  . Recheck thyroid levels    Patient comes in today to have her thyroid levels rechecked. She reports that the levothyroxine was changed to 137mcg daily, and she feels that it is affecting her energy level.    HPI States she has had excessive fatigue and need for sleep since thyroid dose decreased in July. Reports getting 8-9 hours of sleep at night then needing a 2-3 hour nap during the day: Endorses lethargy. Denies depression, heavy menses, or blood in her stool.  Review of Systems  Gastrointestinal: Negative for nausea and vomiting.       Objective:   Physical Exam  Constitutional: She appears well-developed and well-nourished. No distress.  Cardiovascular: Normal rate and regular rhythm.  Pulmonary/Chest: Breath sounds normal.  Abdominal: Soft. Bowel sounds are normal. There is no tenderness.       Assessment:    1. Fatigue, unspecified type - TSH - T4, free - CBC with Differential/Platelet - Comprehensive metabolic panel - VITAMIN D 25 Hydroxy (Vit-D Deficiency, Fractures)  2. Hypothyroidism, unspecified type - TSH - T4, free    Plan:    Further f/u pending lab work.

## 2018-04-14 NOTE — Patient Instructions (Addendum)
We will call you with the lab results. 

## 2018-04-15 LAB — CBC WITH DIFFERENTIAL/PLATELET
BASOS: 1 %
Basophils Absolute: 0.1 10*3/uL (ref 0.0–0.2)
EOS (ABSOLUTE): 0.4 10*3/uL (ref 0.0–0.4)
Eos: 7 %
Hematocrit: 36.6 % (ref 34.0–46.6)
Hemoglobin: 12.1 g/dL (ref 11.1–15.9)
Immature Grans (Abs): 0 10*3/uL (ref 0.0–0.1)
Immature Granulocytes: 0 %
LYMPHS ABS: 1.7 10*3/uL (ref 0.7–3.1)
Lymphs: 30 %
MCH: 30.4 pg (ref 26.6–33.0)
MCHC: 33.1 g/dL (ref 31.5–35.7)
MCV: 92 fL (ref 79–97)
MONOS ABS: 0.5 10*3/uL (ref 0.1–0.9)
Monocytes: 9 %
NEUTROS ABS: 3 10*3/uL (ref 1.4–7.0)
Neutrophils: 53 %
PLATELETS: 286 10*3/uL (ref 150–450)
RBC: 3.98 x10E6/uL (ref 3.77–5.28)
RDW: 13.1 % (ref 12.3–15.4)
WBC: 5.6 10*3/uL (ref 3.4–10.8)

## 2018-04-15 LAB — COMPREHENSIVE METABOLIC PANEL
A/G RATIO: 1.4 (ref 1.2–2.2)
ALT: 17 IU/L (ref 0–32)
AST: 20 IU/L (ref 0–40)
Albumin: 4.3 g/dL (ref 3.5–5.5)
Alkaline Phosphatase: 58 IU/L (ref 39–117)
BILIRUBIN TOTAL: 0.3 mg/dL (ref 0.0–1.2)
BUN/Creatinine Ratio: 12 (ref 9–23)
BUN: 14 mg/dL (ref 6–24)
CHLORIDE: 103 mmol/L (ref 96–106)
CO2: 24 mmol/L (ref 20–29)
Calcium: 9.3 mg/dL (ref 8.7–10.2)
Creatinine, Ser: 1.18 mg/dL — ABNORMAL HIGH (ref 0.57–1.00)
GFR calc non Af Amer: 56 mL/min/{1.73_m2} — ABNORMAL LOW (ref >59)
GFR, EST AFRICAN AMERICAN: 65 mL/min/{1.73_m2} (ref >59)
Globulin, Total: 3.1 g/dL (ref 1.5–4.5)
Glucose: 83 mg/dL (ref 65–99)
POTASSIUM: 4.1 mmol/L (ref 3.5–5.2)
Sodium: 139 mmol/L (ref 134–144)
TOTAL PROTEIN: 7.4 g/dL (ref 6.0–8.5)

## 2018-04-15 LAB — T4, FREE: Free T4: 1.21 ng/dL (ref 0.82–1.77)

## 2018-04-15 LAB — TSH: TSH: 3.02 u[IU]/mL (ref 0.450–4.500)

## 2018-04-15 LAB — VITAMIN D 25 HYDROXY (VIT D DEFICIENCY, FRACTURES): Vit D, 25-Hydroxy: 35.2 ng/mL (ref 30.0–100.0)

## 2018-04-24 ENCOUNTER — Telehealth: Payer: Self-pay | Admitting: Family Medicine

## 2018-04-24 NOTE — Telephone Encounter (Signed)
Discussed labs. Will come in for second opinion from Dr. Leonard Schwartz.

## 2018-04-24 NOTE — Telephone Encounter (Signed)
Patient states that she viewed he results on Mychart and states that she has concerns about her TSH being high and if you plan on adjusting dosage? Patient reports concerns with exhaustion still and wants to know what next steps need to be done to resolve? Patient states that she has no symptoms of sleep apnea nor has her partner ever observed snoring or breathing issues when asleep? KW

## 2018-04-24 NOTE — Telephone Encounter (Signed)
Pt had labs done over a week ago and received results from my chart but wants to talk to him about the results and what he wants to do  Call back #   [phone]  Thanks t eri

## 2018-06-05 ENCOUNTER — Ambulatory Visit: Payer: BLUE CROSS/BLUE SHIELD | Admitting: Family Medicine

## 2018-06-05 DIAGNOSIS — M79671 Pain in right foot: Secondary | ICD-10-CM | POA: Diagnosis not present

## 2018-06-05 DIAGNOSIS — S92424A Nondisplaced fracture of distal phalanx of right great toe, initial encounter for closed fracture: Secondary | ICD-10-CM | POA: Diagnosis not present

## 2018-06-26 ENCOUNTER — Other Ambulatory Visit: Payer: Self-pay | Admitting: Family Medicine

## 2019-02-12 ENCOUNTER — Other Ambulatory Visit: Payer: Self-pay | Admitting: Family Medicine

## 2019-02-12 NOTE — Telephone Encounter (Signed)
[facility] faxed refill request for the following medications:  levothyroxine (SYNTHROID, LEVOTHROID) 137 MCG tablet   Please advise.

## 2019-02-12 NOTE — Telephone Encounter (Signed)
Attempted to contact patient on both numbers. No answer or voicemail

## 2019-02-12 NOTE — Telephone Encounter (Signed)
Needs to establish with new provider as Mikki Santee retired.  Needs thyroid rechecked.  Can give 30 day supply to hold her over until appt, if needed.

## 2019-04-17 ENCOUNTER — Telehealth: Payer: Self-pay | Admitting: Physician Assistant

## 2019-04-17 NOTE — Telephone Encounter (Signed)
Received request from pharmacy about this patient's thyroid medication. Please schedule follow up as she has not been seen in over a year for her thyroid.

## 2019-04-17 NOTE — Telephone Encounter (Signed)
lmtcb to schedule follow up 

## 2019-04-19 ENCOUNTER — Encounter: Payer: Self-pay | Admitting: Physician Assistant

## 2019-04-19 ENCOUNTER — Ambulatory Visit: Payer: BC Managed Care – PPO | Admitting: Physician Assistant

## 2019-04-19 ENCOUNTER — Other Ambulatory Visit: Payer: Self-pay

## 2019-04-19 VITALS — BP 125/88 | HR 70 | Temp 97.7°F | Wt 152.0 lb

## 2019-04-19 DIAGNOSIS — Z23 Encounter for immunization: Secondary | ICD-10-CM | POA: Diagnosis not present

## 2019-04-19 DIAGNOSIS — E039 Hypothyroidism, unspecified: Secondary | ICD-10-CM

## 2019-04-19 NOTE — Progress Notes (Signed)
Patient: Hailey Cantrell Female    DOB: 1976/04/16   43 y.o.   MRN: [id number] Visit Date: 04/19/2019  Today's Provider: Trinna Post, PA-C   Chief Complaint  Patient presents with  . Hypothyroidism   Subjective:     Thyroid Problem Presents for follow-up visit. Symptoms include anxiety, cold intolerance, fatigue and weight gain. Patient reports no constipation, diaphoresis, hair loss, heat intolerance, hoarse voice, leg swelling, nail problem, palpitations, tremors or weight loss. The symptoms have been stable.  Has been on levothyroxine 137 mcg daily, takes it on an empty stomach with a glass of water.   Tetanus shot: 2005, due today PAP: 2019 with Dr. Georga Bora at [facility]. She reports it is normal.  Lasix: every once in a while her fingers swell. Last used one year ago.   Previously used celexa and xanax. No longer using these and does not feel the need.   Allergies  Allergen Reactions  . Sulfa Antibiotics Other (See Comments)    vomiting     Current Outpatient Medications:  .  furosemide (LASIX) 20 MG tablet, Take 1 tablet (20 mg total) by mouth daily. As needed for swelling, Disp: 30 tablet, Rfl: 3 .  levothyroxine (SYNTHROID, LEVOTHROID) 137 MCG tablet, Take 1 capsule (137 mcg total) by mouth daily before breakfast., Disp: 90 tablet, Rfl: 3  Review of Systems  Constitutional: Positive for fatigue, unexpected weight change and weight gain. Negative for activity change, appetite change, chills, diaphoresis, fever and weight loss.  HENT: Negative for hoarse voice.   Respiratory: Negative.   Cardiovascular: Negative.  Negative for palpitations.  Gastrointestinal: Negative.  Negative for constipation.  Endocrine: Positive for cold intolerance. Negative for heat intolerance, polydipsia, polyphagia and polyuria.  Neurological: Negative for dizziness, tremors, light-headedness and headaches.  Psychiatric/Behavioral: The patient is nervous/anxious.       Social History   Tobacco Use  . Smoking status: Former Smoker    Types: Cigarettes    Quit date: 08/16/1997    Years since quitting: 21.6  . Smokeless tobacco: Never Used  Substance Use Topics  . Alcohol use: Yes    Alcohol/week: 0.0 standard drinks    Comment: occasionally      Objective:   BP 125/88 (BP Location: Right Arm, Patient Position: Sitting, Cuff Size: Normal)   Pulse 70   Temp 97.7 F (36.5 C) (Temporal)   Wt 152 lb (68.9 kg)   BMI 27.14 kg/m  Vitals:   04/19/19 0806  BP: 125/88  Pulse: 70  Temp: 97.7 F (36.5 C)  TempSrc: Temporal  Weight: 152 lb (68.9 kg)  Body mass index is 27.14 kg/m.   Physical Exam Constitutional:      Appearance: Normal appearance.  Cardiovascular:     Rate and Rhythm: Normal rate and regular rhythm.     Heart sounds: Normal heart sounds.  Pulmonary:     Effort: Pulmonary effort is normal.     Breath sounds: Normal breath sounds.  Skin:    General: Skin is warm and dry.  Neurological:     Mental Status: She is alert and oriented to person, place, and time. Mental status is at baseline.  Psychiatric:        Mood and Affect: Mood normal.        Behavior: Behavior normal.      No results found for any visits on 04/19/19.     Assessment & Plan    1. Hypothyroidism, unspecified type  Recheck labs, she is currently taking 137 mcg synthroid daily. [facility] PAP smear.   - TSH  2. Need for prophylactic vaccination against diphtheria and tetanus  - Tdap vaccine greater than or equal to 7yo IM'  The entirety of the information documented in the History of Present Illness, Review of Systems and Physical Exam were personally obtained by me. Portions of this information were initially documented by Kavin LeechLaura Walsh, CMA and reviewed by me for thoroughness and accuracy.   Follow up 1 year for thyroid    Trey SailorsAdriana M Sandria Mcenroe, PA-C  [facility]

## 2019-04-19 NOTE — Patient Instructions (Signed)
Hypothyroidism  Hypothyroidism is when the thyroid gland does not make enough of certain hormones (it is underactive). The thyroid gland is a small gland located in the lower front part of the neck, just in front of the windpipe (trachea). This gland makes hormones that help control how the body uses food for energy (metabolism) as well as how the heart and brain function. These hormones also play a role in keeping your bones strong. When the thyroid is underactive, it produces too little of the hormones thyroxine (T4) and triiodothyronine (T3). What are the causes? This condition may be caused by:  Hashimoto's disease. This is a disease in which the body's disease-fighting system (immune system) attacks the thyroid gland. This is the most common cause.  Viral infections.  Pregnancy.  Certain medicines.  Birth defects.  Past radiation treatments to the head or neck for cancer.  Past treatment with radioactive iodine.  Past exposure to radiation in the environment.  Past surgical removal of part or all of the thyroid.  Problems with a gland in the center of the brain (pituitary gland).  Lack of enough iodine in the diet. What increases the risk? You are more likely to develop this condition if:  You are female.  You have a family history of thyroid conditions.  You use a medicine called lithium.  You take medicines that affect the immune system (immunosuppressants). What are the signs or symptoms? Symptoms of this condition include:  Feeling as though you have no energy (lethargy).  Not being able to tolerate cold.  Weight gain that is not explained by a change in diet or exercise habits.  Lack of appetite.  Dry skin.  Coarse hair.  Menstrual irregularity.  Slowing of thought processes.  Constipation.  Sadness or depression. How is this diagnosed? This condition may be diagnosed based on:  Your symptoms, your medical history, and a physical exam.  Blood  tests. You may also have imaging tests, such as an ultrasound or MRI. How is this treated? This condition is treated with medicine that replaces the thyroid hormones that your body does not make. After you begin treatment, it may take several weeks for symptoms to go away. Follow these instructions at home:  Take over-the-counter and prescription medicines only as told by your health care provider.  If you start taking any new medicines, tell your health care provider.  Keep all follow-up visits as told by your health care provider. This is important. ? As your condition improves, your dosage of thyroid hormone medicine may change. ? You will need to have blood tests regularly so that your health care provider can monitor your condition. Contact a health care provider if:  Your symptoms do not get better with treatment.  You are taking thyroid replacement medicine and you: ? Sweat a lot. ? Have tremors. ? Feel anxious. ? Lose weight rapidly. ? Cannot tolerate heat. ? Have emotional swings. ? Have diarrhea. ? Feel weak. Get help right away if you have:  Chest pain.  An irregular heartbeat.  A rapid heartbeat.  Difficulty breathing. Summary  Hypothyroidism is when the thyroid gland does not make enough of certain hormones (it is underactive).  When the thyroid is underactive, it produces too little of the hormones thyroxine (T4) and triiodothyronine (T3).  The most common cause is Hashimoto's disease, a disease in which the body's disease-fighting system (immune system) attacks the thyroid gland. The condition can also be caused by viral infections, medicine, pregnancy, or past   radiation treatment to the head or neck.  Symptoms may include weight gain, dry skin, constipation, feeling as though you do not have energy, and not being able to tolerate cold.  This condition is treated with medicine to replace the thyroid hormones that your body does not make. This information  is not intended to replace advice given to you by your health care provider. Make sure you discuss any questions you have with your health care provider. Document Released: 08/02/2005 Document Revised: 07/15/2017 Document Reviewed: 07/13/2017 Elsevier Patient Education  2020 [employer]  

## 2019-04-20 ENCOUNTER — Telehealth: Payer: Self-pay | Admitting: *Deleted

## 2019-04-20 LAB — TSH: TSH: 8.34 u[IU]/mL — ABNORMAL HIGH (ref 0.450–4.500)

## 2019-04-20 MED ORDER — LEVOTHYROXINE SODIUM 150 MCG PO TABS: 150.0000 ug | ORAL_TABLET | Freq: Every day | ORAL | 2 refills | Status: DC

## 2019-04-20 NOTE — Telephone Encounter (Signed)
-----   Message from Mar Daring, [place] sent at 04/20/2019 10:06 AM EDT ----- Thyroid is undercorrected. Would recommend increasing levothyroxine from 12mcg to 175mcg and rechecking in 6-8 weeks

## 2019-04-20 NOTE — Telephone Encounter (Signed)
Patient was notified of results. Expressed understanding. Rx sent to pharmacy. 

## 2019-05-10 ENCOUNTER — Encounter: Payer: Self-pay | Admitting: Physician Assistant

## 2019-05-17 ENCOUNTER — Encounter: Payer: Self-pay | Admitting: Physician Assistant

## 2019-10-15 ENCOUNTER — Telehealth (INDEPENDENT_AMBULATORY_CARE_PROVIDER_SITE_OTHER): Payer: BC Managed Care – PPO | Admitting: Physician Assistant

## 2019-10-15 ENCOUNTER — Encounter: Payer: Self-pay | Admitting: Physician Assistant

## 2019-10-15 DIAGNOSIS — E039 Hypothyroidism, unspecified: Secondary | ICD-10-CM | POA: Diagnosis not present

## 2019-10-15 NOTE — Telephone Encounter (Signed)
   Subjective:   Patient ID: Hailey Cantrell, female    DOB: 1976-02-14, 44 y.o.   MRN: [id number]  Ladeja Pelham is a 44 y.o. female presenting on 10/15/2019 for hypothyroidism.  Virtual Visit via Telephone Note  I connected with Hailey Cantrell on 10/15/2019 at  by telephone and verified that I am speaking with the correct person using two identifiers.   I discussed the limitations, risks, security and privacy concerns of performing an evaluation and management service by telephone and the availability of in person appointments. I also discussed with the patient that there may be a patient responsible charge related to this service. The patient expressed understanding and agreed to proceed.  HPI  Patient is following up today for hypothyroidism. She had her TSH checked in 04/2019 which was under corrected. She was switched from 137 mcg synthroid to 150 mcg synthroid which she has been taking consistently. She reports feeling fatigued and run down. Has not checked labs since then.   Lab Results  Component Value Date   TSH 8.340 (H) 04/19/2019     Social History   Tobacco Use  . Smoking status: Former Smoker    Types: Cigarettes    Quit date: 08/16/1997    Years since quitting: 22.1  . Smokeless tobacco: Never Used  Substance Use Topics  . Alcohol use: Yes    Alcohol/week: 0.0 standard drinks    Comment: occasionally  . Drug use: No    ROS Per HPI unless specifically indicated above   Objective:   Wt Readings from Last 3 Encounters:  04/19/19 152 lb (68.9 kg)  04/14/18 139 lb (63 kg)  02/09/18 138 lb 6.4 oz (62.8 kg)     Results for orders placed or performed in visit on 05/17/19  HM PAP SMEAR  Result Value Ref Range   HM Pap smear Needs to be repeated in 2021     Assessment & Plan:  1. Hypothyroidism, unspecified type  Last TSH under corrected. Change pending labwork. F/u 6-8 weeks if needing adjustment or 1 year if normal.   Osvaldo Angst, PA-C [facility] 10/15/2019, 2:52 PM

## 2019-10-18 DIAGNOSIS — E039 Hypothyroidism, unspecified: Secondary | ICD-10-CM | POA: Diagnosis not present

## 2019-10-19 ENCOUNTER — Telehealth: Payer: Self-pay | Admitting: Physician Assistant

## 2019-10-19 LAB — TSH: TSH: 5.66 u[IU]/mL — ABNORMAL HIGH (ref 0.450–4.500)

## 2019-10-19 MED ORDER — LEVOTHYROXINE SODIUM 175 MCG PO TABS: 175.0000 ug | ORAL_TABLET | Freq: Every day | ORAL | 0 refills | Status: DC

## 2019-10-19 NOTE — Telephone Encounter (Signed)
Can we call and schedule 3 month follow up for hypothyroidism please? Can be virtual.

## 2019-10-19 NOTE — Addendum Note (Signed)
Addended by: Trey Sailors on: 10/19/2019 08:44 AM   Modules accepted: Orders

## 2019-10-19 NOTE — Telephone Encounter (Signed)
Patient schedule appointment for 01/21/2020 @ 1:20 PM.

## 2019-12-27 ENCOUNTER — Other Ambulatory Visit: Payer: Self-pay | Admitting: Physician Assistant

## 2019-12-27 DIAGNOSIS — E039 Hypothyroidism, unspecified: Secondary | ICD-10-CM

## 2020-01-21 ENCOUNTER — Ambulatory Visit: Payer: Self-pay | Admitting: Physician Assistant

## 2020-01-22 ENCOUNTER — Encounter: Payer: Self-pay | Admitting: Physician Assistant

## 2020-01-22 ENCOUNTER — Ambulatory Visit: Payer: BC Managed Care – PPO | Admitting: Physician Assistant

## 2020-01-22 ENCOUNTER — Other Ambulatory Visit: Payer: Self-pay

## 2020-01-22 VITALS — BP 116/82 | HR 76 | Temp 96.9°F | Wt 160.0 lb

## 2020-01-22 DIAGNOSIS — E039 Hypothyroidism, unspecified: Secondary | ICD-10-CM

## 2020-01-22 NOTE — Progress Notes (Signed)
Established patient visit   Patient: Hailey Cantrell   DOB: 1976-02-10   44 y.o. Female  MRN: [id number] Visit Date: 01/22/2020  Today's healthcare provider: Trey Sailors, PA-C   Chief Complaint  Patient presents with  . Hypothyroidism  I,Alyzae Hawkey M Rosco Harriott,acting as a scribe for [employer], PA-C.,have documented all relevant documentation on the behalf of Trey Sailors, PA-C,as directed by  Trey Sailors, PA-C while in the presence of Trey Sailors, PA-C.  Subjective    HPI   Mom with thyroid cancer diagnosed at 52, throidectomy Maternal grandmother had thyroid cancer aged 98, thyroidectomy Maternal aunt: diagnosed thyroid cancer at age 3, breast cancer twice, endometrial cancer   Hypothyroid, follow-up  Lab Results  Component Value Date   TSH 5.660 (H) 10/18/2019   TSH 8.340 (H) 04/19/2019   TSH 3.020 04/14/2018   FREET4 1.21 04/14/2018   FREET4 2.01 (H) 02/09/2018   Wt Readings from Last 3 Encounters:  01/22/20 160 lb (72.6 kg)  04/19/19 152 lb (68.9 kg)  04/14/18 139 lb (63 kg)    She was last seen for hypothyroid 9 months ago.  Management since that visit includes continue current medication. She reports good compliance with treatment. She is not having side effects.   Symptoms: No change in energy level No constipation  No diarrhea No heat / cold intolerance  No nervousness No palpitations  No weight changes    -----------------------------------------------------------------------------------------      Medications: Outpatient Medications Prior to Visit  Medication Sig  . levothyroxine (SYNTHROID) 175 MCG tablet TAKE 1 TABLET DAILY BEFORE BREAKFAST  . furosemide (LASIX) 20 MG tablet Take 1 tablet (20 mg total) by mouth daily. As needed for swelling (Patient not taking: Reported on 01/22/2020)   No facility-administered medications prior to visit.    Review of Systems  Constitutional: Negative.   Respiratory: Negative.     Cardiovascular: Negative.   Hematological: Negative.       Objective    BP 116/82 (BP Location: Left Arm, Patient Position: Sitting, Cuff Size: Normal)   Pulse 76   Temp (!) 96.9 F (36.1 C) (Temporal)   Wt 160 lb (72.6 kg)   SpO2 96%   BMI 28.57 kg/m    Physical Exam Constitutional:      Appearance: Normal appearance.  Neck:     Thyroid: No thyroid mass or thyromegaly.  Cardiovascular:     Rate and Rhythm: Normal rate and regular rhythm.     Heart sounds: Normal heart sounds.  Pulmonary:     Breath sounds: Normal breath sounds.  Skin:    General: Skin is warm and dry.  Neurological:     General: No focal deficit present.     Mental Status: She is alert and oriented to person, place, and time.  Psychiatric:        Mood and Affect: Mood normal.        Behavior: Behavior normal.       No results found for any visits on 01/22/20.  Assessment & Plan    1. Hypothyroidism, unspecified type  Undercorrected, had increased to synthroid 175 mcg daily. Check today. She is due for PAP smear 06/2020 and have reminded her of this. Adjusted pending labwork. Offered patient referral to oncology for further evaluation family history thyroid cancer. Follow up 6 months.   - TSH    No follow-ups on file.      I, Trey Sailors, PA-C, have reviewed  all documentation for this visit. The documentation on 01/22/20 for the exam, diagnosis, procedures, and orders are all accurate and complete.    Paulene Floor  [facility] [phone] (phone) [phone] (fax)  [place]

## 2020-01-23 ENCOUNTER — Encounter: Payer: Self-pay | Admitting: Physician Assistant

## 2020-01-23 LAB — TSH: TSH: 0.079 u[IU]/mL — ABNORMAL LOW (ref 0.450–4.500)

## 2020-01-23 MED ORDER — LEVOTHYROXINE SODIUM 150 MCG PO TABS: 150.0000 ug | ORAL_TABLET | Freq: Every day | ORAL | 1 refills | Status: AC

## 2020-01-23 NOTE — Addendum Note (Signed)
Addended by: Trey Sailors on: 01/23/2020 01:14 PM   Modules accepted: Orders

## 2020-05-28 DIAGNOSIS — R7309 Other abnormal glucose: Secondary | ICD-10-CM | POA: Diagnosis not present

## 2020-05-28 DIAGNOSIS — Z131 Encounter for screening for diabetes mellitus: Secondary | ICD-10-CM | POA: Diagnosis not present

## 2020-05-28 DIAGNOSIS — R946 Abnormal results of thyroid function studies: Secondary | ICD-10-CM | POA: Diagnosis not present

## 2020-05-28 DIAGNOSIS — Z136 Encounter for screening for cardiovascular disorders: Secondary | ICD-10-CM | POA: Diagnosis not present

## 2020-05-28 DIAGNOSIS — Z124 Encounter for screening for malignant neoplasm of cervix: Secondary | ICD-10-CM | POA: Diagnosis not present

## 2020-05-28 DIAGNOSIS — Z13220 Encounter for screening for lipoid disorders: Secondary | ICD-10-CM | POA: Diagnosis not present

## 2020-05-28 DIAGNOSIS — Z01419 Encounter for gynecological examination (general) (routine) without abnormal findings: Secondary | ICD-10-CM | POA: Diagnosis not present

## 2020-07-23 ENCOUNTER — Ambulatory Visit: Payer: Self-pay | Admitting: Physician Assistant

## 2020-07-24 DIAGNOSIS — R946 Abnormal results of thyroid function studies: Secondary | ICD-10-CM | POA: Diagnosis not present

## 2020-07-24 DIAGNOSIS — R7989 Other specified abnormal findings of blood chemistry: Secondary | ICD-10-CM | POA: Diagnosis not present

## 2020-08-11 ENCOUNTER — Ambulatory Visit: Payer: Self-pay | Admitting: Physician Assistant

## 2020-12-03 DIAGNOSIS — Z20822 Contact with and (suspected) exposure to covid-19: Secondary | ICD-10-CM | POA: Diagnosis not present

## 2021-07-17 DIAGNOSIS — J0110 Acute frontal sinusitis, unspecified: Secondary | ICD-10-CM | POA: Diagnosis not present

## 2021-07-17 DIAGNOSIS — U071 COVID-19: Secondary | ICD-10-CM | POA: Diagnosis not present

## 2021-11-04 DIAGNOSIS — Z01419 Encounter for gynecological examination (general) (routine) without abnormal findings: Secondary | ICD-10-CM | POA: Diagnosis not present

## 2021-11-04 DIAGNOSIS — N951 Menopausal and female climacteric states: Secondary | ICD-10-CM | POA: Diagnosis not present

## 2021-11-20 DIAGNOSIS — Z1231 Encounter for screening mammogram for malignant neoplasm of breast: Secondary | ICD-10-CM | POA: Diagnosis not present

## 2021-11-24 DIAGNOSIS — I10 Essential (primary) hypertension: Secondary | ICD-10-CM | POA: Diagnosis not present

## 2021-11-24 DIAGNOSIS — E559 Vitamin D deficiency, unspecified: Secondary | ICD-10-CM | POA: Diagnosis not present

## 2021-11-24 DIAGNOSIS — Z13228 Encounter for screening for other metabolic disorders: Secondary | ICD-10-CM | POA: Diagnosis not present

## 2021-11-24 DIAGNOSIS — Z13220 Encounter for screening for lipoid disorders: Secondary | ICD-10-CM | POA: Diagnosis not present

## 2021-11-24 DIAGNOSIS — Z1321 Encounter for screening for nutritional disorder: Secondary | ICD-10-CM | POA: Diagnosis not present

## 2021-11-24 DIAGNOSIS — Z131 Encounter for screening for diabetes mellitus: Secondary | ICD-10-CM | POA: Diagnosis not present

## 2021-11-24 DIAGNOSIS — E782 Mixed hyperlipidemia: Secondary | ICD-10-CM | POA: Diagnosis not present

## 2022-06-21 DIAGNOSIS — F4323 Adjustment disorder with mixed anxiety and depressed mood: Secondary | ICD-10-CM | POA: Diagnosis not present

## 2023-12-12 ENCOUNTER — Ambulatory Visit: Payer: Self-pay

## 2023-12-12 NOTE — Telephone Encounter (Signed)
 Copied from CRM [id number]. Topic: Clinical - Red Word Triage >> Dec 12, 2023  8:05 AM Dimple Francis wrote: Red Word that prompted transfer to Nurse Triage: Galbladder pain since thursday, very painful, comes and goes.   Chief Complaint: Right upper abdominal pain Symptoms: right upper abdominal pain x 2-3 nights Frequency: 2-3 nights Pertinent Negatives: Patient denies back pain, diarrhea, fever, urination pain, vomiting, blood in stool Disposition: [] ED /[x] Urgent Care (no appt availability in office) / [] Appointment(In office/virtual)/ []  [facility]/ [] Home Care/ [] Refused Recommended Disposition /[] [facility]/ []  Follow-up with PCP Additional Notes: Patient called and advised that she has been having right upper abdominal pain that started 2-3 nights ago. Patient denies any back pain, diarrhea, fever, urination pain, vomiting, or blood in her stools. Patient states that her pain is about a 2.5 out of 10 constantly but if she tries to eat solid food, the pain gets bad pretty quick. Patient is advised that there are no appointments with the [facility] within the next 4 hours.  Recommendation is that the patient is seen in the next 4 hours.  Patient states that she is going to call her GYN provider and see about getting an ultrasound done.  She states that she went to Urgent Care yesterday and they told her she needed an ultrasound. Patient is advised that after she is seen and evaluated for this she can call us  back for continuation of care if she would like to continue to be seen here. She verbalized understanding. Patient is advised that if she gets worse to go immediately to the Emergency Room for further evaluation. Patient verbalized understanding.  Reason for Disposition  [1] MILD-MODERATE pain AND [2] constant AND [3] present > 2 hours  Answer Assessment - Initial Assessment Questions 1. LOCATION: "Where does it hurt?"      Right upper  abdomen 2. RADIATION: "Does the pain shoot anywhere else?" (e.g., chest, back)     No 3. ONSET: "When did the pain begin?" (e.g., minutes, hours or days ago)      2-3 nights ago 4. SUDDEN: "Gradual or sudden onset?"     Started 2-3 nights ago 5. PATTERN "Does the pain come and go, or is it constant?"    - If it comes and goes: "How long does it last?" "Do you have pain now?"     (Note: Comes and goes means the pain is intermittent. It goes away completely between bouts.)    - If constant: "Is it getting better, staying the same, or getting worse?"      (Note: Constant means the pain never goes away completely; most serious pain is constant and gets worse.)      constant 6. SEVERITY: "How bad is the pain?"  (e.g., Scale 1-10; mild, moderate, or severe)    - MILD (1-3): Doesn't interfere with normal activities, abdomen soft and not tender to touch.     - MODERATE (4-7): Interferes with normal activities or awakens from sleep, abdomen tender to touch.     - SEVERE (8-10): Excruciating pain, doubled over, unable to do any normal activities.       2.5 now but when trying to eat solid food it gets bad fast per patient 7. RECURRENT SYMPTOM: "Have you ever had this type of stomach pain before?" If Yes, ask: "When was the last time?" and "What happened that time?"      No 8. CAUSE: "What do you think is causing  the stomach pain?"     Patient states she has a gallbladder attack a long time ago but she doesn't remember exactly how that felt 9. RELIEVING/AGGRAVATING FACTORS: "What makes it better or worse?" (e.g., antacids, bending or twisting motion, bowel movement)     Leaning to the right makes it worse 10. OTHER SYMPTOMS: "Do you have any other symptoms?" (e.g., back pain, diarrhea, fever, urination pain, vomiting)       No 11. PREGNANCY: "Is there any chance you are pregnant?" "When was your last menstrual period?"       No  Protocols used: Abdominal Pain - [facility]
